# Patient Record
Sex: Male | Born: 2000 | Hispanic: Yes | Marital: Single
Health system: Southern US, Community
[De-identification: ages and names within clinical notes are randomized; demographics above are authoritative.]

---

## 2020-05-30 ENCOUNTER — Other Ambulatory Visit: Payer: Self-pay

## 2020-05-30 ENCOUNTER — Emergency Department: Payer: HRSA Program

## 2020-05-30 ENCOUNTER — Emergency Department
Admission: EM | Admit: 2020-05-30 | Discharge: 2020-05-30 | Disposition: A | Discharge status: Home / Self Care | Payer: HRSA Program | Attending: Emergency Medicine | Admitting: Emergency Medicine

## 2020-05-30 DIAGNOSIS — R000 Tachycardia, unspecified: Secondary | ICD-10-CM | POA: Diagnosis not present

## 2020-05-30 DIAGNOSIS — U071 COVID-19: Secondary | ICD-10-CM | POA: Insufficient documentation

## 2020-05-30 DIAGNOSIS — E860 Dehydration: Secondary | ICD-10-CM | POA: Insufficient documentation

## 2020-05-30 DIAGNOSIS — R42 Dizziness and giddiness: Secondary | ICD-10-CM | POA: Diagnosis present

## 2020-05-30 LAB — URINALYSIS, COMPLETE (UACMP) WITH MICROSCOPIC
Bacteria, UA: NONE SEEN
Bilirubin Urine: NEGATIVE
Glucose, UA: NEGATIVE mg/dL
Hgb urine dipstick: NEGATIVE
Ketones, ur: 80 mg/dL — AB
Leukocytes,Ua: NEGATIVE
Nitrite: NEGATIVE
Protein, ur: NEGATIVE mg/dL
Specific Gravity, Urine: 1.028 (ref 1.005–1.030)
Squamous Epithelial / HPF: NONE SEEN (ref 0–5)
pH: 6 (ref 5.0–8.0)

## 2020-05-30 LAB — BASIC METABOLIC PANEL WITH GFR
Anion gap: 15 (ref 5–15)
BUN: 19 mg/dL (ref 6–20)
CO2: 21 mmol/L — ABNORMAL LOW (ref 22–32)
Calcium: 9.4 mg/dL (ref 8.9–10.3)
Chloride: 103 mmol/L (ref 98–111)
Creatinine, Ser: 1.28 mg/dL — ABNORMAL HIGH (ref 0.61–1.24)
GFR calc Af Amer: >60 mL/min
GFR calc non Af Amer: >60 mL/min
Glucose, Bld: 99 mg/dL (ref 70–99)
Potassium: 3.9 mmol/L (ref 3.5–5.1)
Sodium: 139 mmol/L (ref 135–145)

## 2020-05-30 LAB — CBC
HCT: 44.1 % (ref 39.0–52.0)
Hemoglobin: 15.8 g/dL (ref 13.0–17.0)
MCH: 32.3 pg (ref 26.0–34.0)
MCHC: 35.8 g/dL (ref 30.0–36.0)
MCV: 90.2 fL (ref 80.0–100.0)
Platelets: 123 K/uL — ABNORMAL LOW (ref 150–400)
RBC: 4.89 MIL/uL (ref 4.22–5.81)
RDW: 11.6 % (ref 11.5–15.5)
WBC: 4.9 K/uL (ref 4.0–10.5)
nRBC: 0 % (ref 0.0–0.2)

## 2020-05-30 LAB — TROPONIN I (HIGH SENSITIVITY): Troponin I (High Sensitivity): <2 ng/L (ref <18)

## 2020-05-30 MED ORDER — LACTATED RINGERS IV BOLUS
1000.0000 mL | Freq: Once | INTRAVENOUS | Status: AC
  Administered 2020-05-30: 1000 mL via INTRAVENOUS

## 2020-05-30 MED ORDER — ACETAMINOPHEN 500 MG PO TABS
1000.0000 mg | ORAL_TABLET | Freq: Once | ORAL | Status: AC
  Administered 2020-05-30: 1000 mg via ORAL
  Filled 2020-05-30: qty 2

## 2020-05-30 MED ORDER — ONDANSETRON 4 MG PO TBDP
4.0000 mg | ORAL_TABLET | Freq: Three times a day (TID) | ORAL | 0 refills | Status: AC | PRN
Start: 2020-05-30 — End: ?

## 2020-05-30 MED ORDER — DEXAMETHASONE SODIUM PHOSPHATE 10 MG/ML IJ SOLN
10.0000 mg | Freq: Once | INTRAMUSCULAR | Status: AC
  Administered 2020-05-30: 10 mg via INTRAVENOUS
  Filled 2020-05-30: qty 1

## 2020-05-30 MED ORDER — ACETAMINOPHEN 325 MG PO TABS
650.0000 mg | ORAL_TABLET | Freq: Four times a day (QID) | ORAL | 0 refills | Status: AC | PRN
Start: 2020-05-30 — End: 2021-05-30

## 2020-05-30 MED ORDER — DIPHENHYDRAMINE HCL 50 MG/ML IJ SOLN
25.0000 mg | Freq: Once | INTRAMUSCULAR | Status: AC
  Administered 2020-05-30: 25 mg via INTRAVENOUS
  Filled 2020-05-30: qty 1

## 2020-05-30 MED ORDER — METOCLOPRAMIDE HCL 5 MG/ML IJ SOLN
10.0000 mg | Freq: Once | INTRAMUSCULAR | Status: AC
  Administered 2020-05-30: 10 mg via INTRAVENOUS
  Filled 2020-05-30: qty 2

## 2020-05-30 MED ORDER — KETOROLAC TROMETHAMINE 30 MG/ML IJ SOLN
15.0000 mg | Freq: Once | INTRAMUSCULAR | Status: AC
  Administered 2020-05-30: 15 mg via INTRAVENOUS
  Filled 2020-05-30: qty 1

## 2020-05-30 NOTE — ED Provider Notes (Signed)
[facility] Provider Note  ____________________________________________   First MD Initiated Contact with Patient 05/30/20 1500     (approximate)  I have reviewed the triage vital signs and the nursing notes.   HISTORY  Chief Complaint Weakness, Headache, Dizziness, and Generalized Body Aches    HPI Noah Herrera is a 19 y.o. male  Here with weakness, dizziness, and generalized body aches. Pt states that his sx started approximately a week ago with fever, chills, and body aches. He went to get tested for COVID and was positive on Monday. Since then, pt has had ongoing fever, chills, and loss of appetite. He has lost his sense of smell and taste. He's had a mild cough that is slightly improving, denies any SOB. He has had difficulty eating 2/2 loss of appetite. Denies any abd pain, n/v or diarrhea. He c/o mild generalized HA, not acute in onset. No known sick contacts. He is not vaccinated.        No past medical history on file.  There are no problems to display for this patient.     Prior to Admission medications   Medication Sig Start Date End Date Taking? Authorizing Provider  acetaminophen (TYLENOL) 325 MG tablet Take 2 tablets (650 mg total) by mouth every 6 (six) hours as needed for moderate pain or fever. 05/30/20 05/30/21  Shaune Pollack, MD  ondansetron (ZOFRAN ODT) 4 MG disintegrating tablet Take 1 tablet (4 mg total) by mouth every 8 (eight) hours as needed for nausea or vomiting. 05/30/20   Shaune Pollack, MD    Allergies Shellfish allergy  No family history on file.  Social History Social History   Tobacco Use  . Smoking status: Not on file  Substance Use Topics  . Alcohol use: Not on file  . Drug use: Not on file    Review of Systems  Review of Systems  Constitutional: Positive for chills, fatigue and fever.  HENT: Negative for sore throat.   Respiratory: Positive for cough. Negative for shortness of  breath.   Cardiovascular: Negative for chest pain.  Gastrointestinal: Negative for abdominal pain.  Genitourinary: Negative for flank pain.  Musculoskeletal: Negative for neck pain.  Skin: Negative for rash and wound.  Allergic/Immunologic: Negative for immunocompromised state.  Neurological: Positive for weakness, light-headedness and headaches. Negative for numbness.  Hematological: Does not bruise/bleed easily.  All other systems reviewed and are negative.    ____________________________________________  PHYSICAL EXAM:      VITAL SIGNS: ED Triage Vitals  Enc Vitals Group     BP 05/30/20 1046 133/83     Pulse Rate 05/30/20 1046 (!) 109     Resp 05/30/20 1046 18     Temp 05/30/20 1046 98.4 F (36.9 C)     Temp Source 05/30/20 1046 Oral     SpO2 05/30/20 1046 100 %     Weight 05/30/20 1046 140 lb (63.5 kg)     Height 05/30/20 1046 5\' 7"  (1.702 m)     Head Circumference --      Peak Flow --      Pain Score 05/30/20 1100 6     Pain Loc --      Pain Edu? --      Excl. in GC? --      Physical Exam Vitals and nursing note reviewed.  Constitutional:      General: He is not in acute distress.    Appearance: He is well-developed.  HENT:  Head: Normocephalic and atraumatic.     Mouth/Throat:     Comments: Dry MM Eyes:     Conjunctiva/sclera: Conjunctivae normal.  Cardiovascular:     Rate and Rhythm: Regular rhythm. Tachycardia present.     Heart sounds: Normal heart sounds. No murmur heard.  No friction rub.  Pulmonary:     Effort: Pulmonary effort is normal. No respiratory distress.     Breath sounds: Normal breath sounds. No wheezing or rales.  Abdominal:     General: There is no distension.     Palpations: Abdomen is soft.     Tenderness: There is no abdominal tenderness.  Musculoskeletal:     Cervical back: Neck supple.  Skin:    General: Skin is warm.     Capillary Refill: Capillary refill takes less than 2 seconds.  Neurological:     Mental Status: He  is alert and oriented to person, place, and time.     Motor: No abnormal muscle tone.       ____________________________________________   LABS (all labs ordered are listed, but only abnormal results are displayed)  Labs Reviewed  BASIC METABOLIC PANEL - Abnormal; Notable for the following components:      Result Value   CO2 21 (*)    Creatinine, Ser 1.28 (*)    All other components within normal limits  CBC - Abnormal; Notable for the following components:   Platelets 123 (*)    All other components within normal limits  URINALYSIS, COMPLETE (UACMP) WITH MICROSCOPIC - Abnormal; Notable for the following components:   Color, Urine YELLOW (*)    APPearance HAZY (*)    Ketones, ur 80 (*)    All other components within normal limits  TROPONIN I (HIGH SENSITIVITY)    ____________________________________________  EKG: Sinus tachycardia, VR 110. PR 136, QRS 92, QTc 419. No acute St elevations or depressions. No EKG evidence of acute ischemia or infarct. ________________________________________  RADIOLOGY All imaging, including plain films, CT scans, and ultrasounds, independently reviewed by me, and interpretations confirmed via formal radiology reads.  ED MD interpretation:   CXR: Clear, no focal PNA  Official radiology report(s): DG Chest 2 View  Result Date: 05/30/2020 CLINICAL DATA:  COVID-19 positive with headache and sore throat EXAM: CHEST - 2 VIEW COMPARISON:  None. FINDINGS: The lungs are clear. The heart size and pulmonary vascularity are normal. No adenopathy. No bone lesions. IMPRESSION: Lungs clear.  No evident adenopathy. Electronically Signed   By: Bretta Bang III M.D.   On: 05/30/2020 12:15    ____________________________________________  PROCEDURES   Procedure(s) performed (including Critical Care):  Procedures  ____________________________________________  INITIAL IMPRESSION / MDM / ASSESSMENT AND PLAN / ED COURSE  As part of my medical  decision making, I reviewed the following data within the electronic MEDICAL RECORD NUMBER Nursing notes reviewed and incorporated, Old chart reviewed, Notes from prior ED visits, and [employer] Controlled Substance Database       *Tai Skelly was evaluated in Emergency Department on 05/30/2020 for the symptoms described in the history of present illness. He was evaluated in the context of the global COVID-19 pandemic, which necessitated consideration that the patient might be at risk for infection with the SARS-CoV-2 virus that causes COVID-19. Institutional protocols and algorithms that pertain to the evaluation of patients at risk for COVID-19 are in a state of rapid change based on information released by regulatory bodies including the CDC and federal and state organizations. These policies and algorithms were followed  during the patient's care in the ED.  Some ED evaluations and interventions may be delayed as a result of limited staffing during the pandemic.*     Medical Decision Making:  19 yo M here with weakness, headache, dehydration in setting of COVID-19. No SOB, hypoxia, or signs of resp distress or PNA on CXR. His labs show likely dehydration with CO2 21, ketonuria but o/w are unremarkable. Mild thrombocytopenia likely viral related. Pt given fluids, antiemetics, and analgesics w/ good effect. Will encourage ongoing supportive care and good return precautions, as well as home quarantine.  ____________________________________________  FINAL CLINICAL IMPRESSION(S) / ED DIAGNOSES  Final diagnoses:  Dehydration  COVID-19     MEDICATIONS GIVEN DURING THIS VISIT:  Medications  lactated ringers bolus 1,000 mL (0 mLs Intravenous Stopped 05/30/20 1810)  dexamethasone (DECADRON) injection 10 mg (10 mg Intravenous Given 05/30/20 1617)  ketorolac (TORADOL) 30 MG/ML injection 15 mg (15 mg Intravenous Given 05/30/20 1620)  metoCLOPramide (REGLAN) injection 10 mg (10 mg Intravenous Given 05/30/20 1619)    diphenhydrAMINE (BENADRYL) injection 25 mg (25 mg Intravenous Given 05/30/20 1617)  acetaminophen (TYLENOL) tablet 1,000 mg (1,000 mg Oral Given 05/30/20 1808)  lactated ringers bolus 1,000 mL (1,000 mLs Intravenous New Bag/Given 05/30/20 1809)     ED Discharge Orders         Ordered    ondansetron (ZOFRAN ODT) 4 MG disintegrating tablet  Every 8 hours PRN     Discontinue  Reprint     05/30/20 1846    acetaminophen (TYLENOL) 325 MG tablet  Every 6 hours PRN     Discontinue  Reprint     05/30/20 1846           Note:  This document was prepared using Dragon voice recognition software and may include unintentional dictation errors.   Shaune Pollack, MD 05/30/20 1850

## 2020-05-30 NOTE — ED Triage Notes (Signed)
Pt states that last Wednesday he started with a sore throat and progressively began to feel worse, states that he took a home test Monday and found he was + for covid, states that he has had a headache since yesterday, feels weak and dizzy, denies taking any meds this am

## 2021-07-20 IMAGING — CR DG CHEST 2V
1 series · 2 of 2 positions shown · non-contrast
Comparison: None.

CLINICAL DATA: [id number] positive with headache and sore throat

EXAM:
CHEST - 2 VIEW

[Series 1: w chest pa · 0.14mm/px · 2 of 2 slices shown]
[im 1/2]
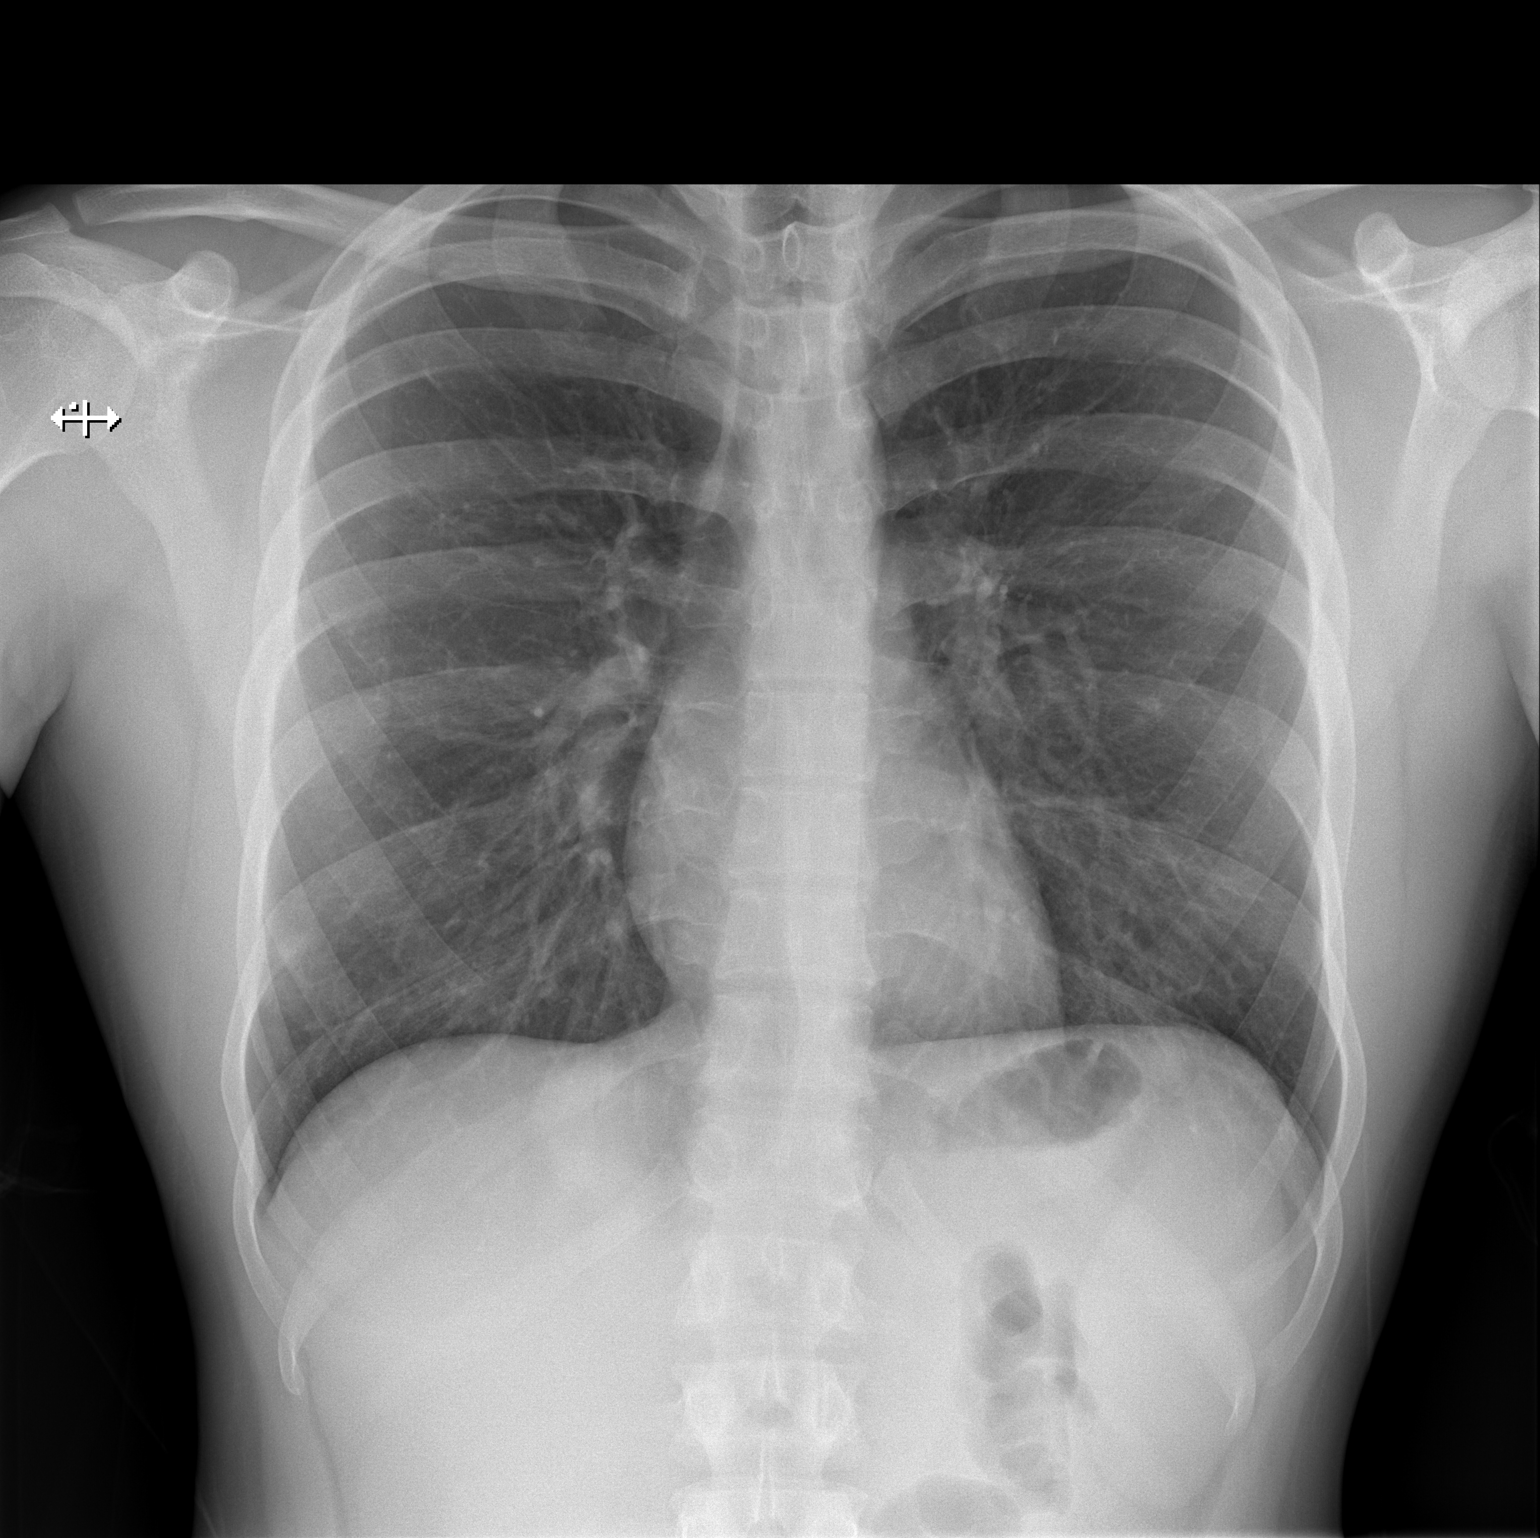
[im 2/2]
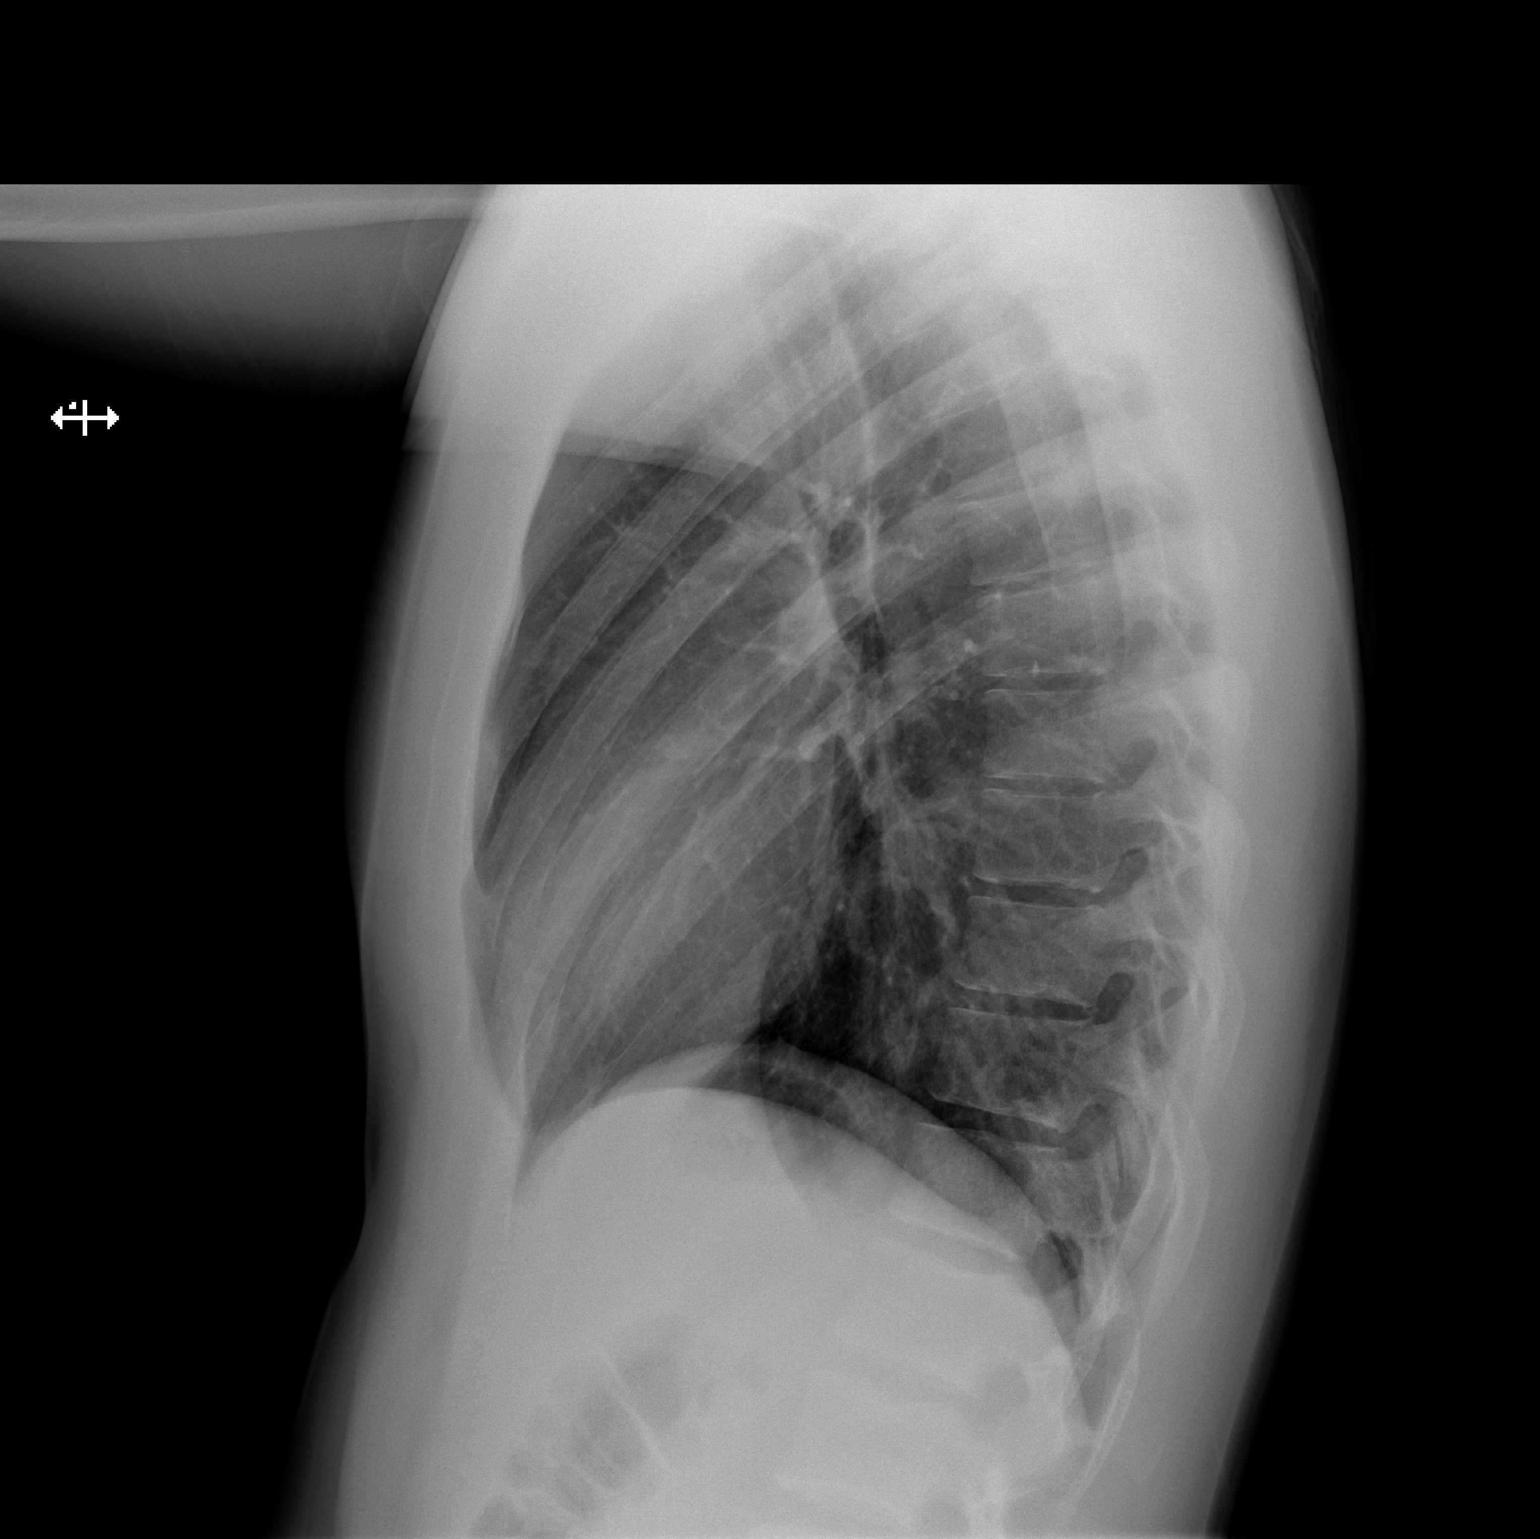

[2 of 2 positions shown; findings below may reference images not displayed]

FINDINGS: The lungs are clear. The heart size and pulmonary vascularity are
normal. No adenopathy. No bone lesions.
IMPRESSION: Lungs clear.  No evident adenopathy.
